# Patient Record
Sex: Male | Born: 1960 | Race: Black or African American | Hispanic: No | Marital: Single | Smoking: Current some day smoker
Health system: Southern US, Community
[De-identification: ages and names within clinical notes are randomized; demographics above are authoritative.]

## PROBLEM LIST (undated history)

## (undated) DIAGNOSIS — I639 Cerebral infarction, unspecified: Secondary | ICD-10-CM

## (undated) DIAGNOSIS — I10 Essential (primary) hypertension: Secondary | ICD-10-CM

## (undated) DIAGNOSIS — I739 Peripheral vascular disease, unspecified: Secondary | ICD-10-CM

## (undated) DIAGNOSIS — I2510 Atherosclerotic heart disease of native coronary artery without angina pectoris: Secondary | ICD-10-CM

## (undated) DIAGNOSIS — E785 Hyperlipidemia, unspecified: Secondary | ICD-10-CM

## (undated) DIAGNOSIS — G4733 Obstructive sleep apnea (adult) (pediatric): Secondary | ICD-10-CM

## (undated) HISTORY — PX: FEMORAL-POPLITEAL BYPASS GRAFT: SHX937

---

## 2012-02-24 DIAGNOSIS — I10 Essential (primary) hypertension: Secondary | ICD-10-CM | POA: Insufficient documentation

## 2012-02-24 DIAGNOSIS — I639 Cerebral infarction, unspecified: Secondary | ICD-10-CM | POA: Insufficient documentation

## 2013-02-04 ENCOUNTER — Encounter (HOSPITAL_COMMUNITY): Payer: Self-pay | Admitting: Pulmonary Disease

## 2013-02-04 ENCOUNTER — Inpatient Hospital Stay (HOSPITAL_COMMUNITY): Payer: Medicare Other

## 2013-02-04 ENCOUNTER — Inpatient Hospital Stay (HOSPITAL_COMMUNITY)
Admission: AD | Admit: 2013-02-04 | Discharge: 2013-02-06 | DRG: 683 | Discharge status: Against Medical Advice | Payer: Medicare Other | Source: Other Acute Inpatient Hospital | Attending: Internal Medicine | Admitting: Internal Medicine

## 2013-02-04 DIAGNOSIS — E8779 Other fluid overload: Secondary | ICD-10-CM | POA: Diagnosis present

## 2013-02-04 DIAGNOSIS — G4733 Obstructive sleep apnea (adult) (pediatric): Secondary | ICD-10-CM | POA: Diagnosis present

## 2013-02-04 DIAGNOSIS — J449 Chronic obstructive pulmonary disease, unspecified: Secondary | ICD-10-CM | POA: Diagnosis present

## 2013-02-04 DIAGNOSIS — N185 Chronic kidney disease, stage 5: Secondary | ICD-10-CM | POA: Diagnosis present

## 2013-02-04 DIAGNOSIS — R110 Nausea: Secondary | ICD-10-CM | POA: Diagnosis present

## 2013-02-04 DIAGNOSIS — Z7982 Long term (current) use of aspirin: Secondary | ICD-10-CM

## 2013-02-04 DIAGNOSIS — E875 Hyperkalemia: Secondary | ICD-10-CM

## 2013-02-04 DIAGNOSIS — I161 Hypertensive emergency: Secondary | ICD-10-CM | POA: Diagnosis present

## 2013-02-04 DIAGNOSIS — Z9861 Coronary angioplasty status: Secondary | ICD-10-CM

## 2013-02-04 DIAGNOSIS — Z8673 Personal history of transient ischemic attack (TIA), and cerebral infarction without residual deficits: Secondary | ICD-10-CM

## 2013-02-04 DIAGNOSIS — I120 Hypertensive chronic kidney disease with stage 5 chronic kidney disease or end stage renal disease: Principal | ICD-10-CM | POA: Diagnosis present

## 2013-02-04 DIAGNOSIS — Z79899 Other long term (current) drug therapy: Secondary | ICD-10-CM

## 2013-02-04 DIAGNOSIS — I5023 Acute on chronic systolic (congestive) heart failure: Secondary | ICD-10-CM | POA: Diagnosis present

## 2013-02-04 DIAGNOSIS — N179 Acute kidney failure, unspecified: Secondary | ICD-10-CM

## 2013-02-04 DIAGNOSIS — F17200 Nicotine dependence, unspecified, uncomplicated: Secondary | ICD-10-CM | POA: Diagnosis present

## 2013-02-04 DIAGNOSIS — J4489 Other specified chronic obstructive pulmonary disease: Secondary | ICD-10-CM | POA: Diagnosis present

## 2013-02-04 DIAGNOSIS — I739 Peripheral vascular disease, unspecified: Secondary | ICD-10-CM | POA: Diagnosis present

## 2013-02-04 DIAGNOSIS — E669 Obesity, unspecified: Secondary | ICD-10-CM | POA: Diagnosis present

## 2013-02-04 DIAGNOSIS — E785 Hyperlipidemia, unspecified: Secondary | ICD-10-CM | POA: Diagnosis present

## 2013-02-04 DIAGNOSIS — D649 Anemia, unspecified: Secondary | ICD-10-CM | POA: Diagnosis present

## 2013-02-04 DIAGNOSIS — R062 Wheezing: Secondary | ICD-10-CM | POA: Diagnosis present

## 2013-02-04 DIAGNOSIS — Z720 Tobacco use: Secondary | ICD-10-CM | POA: Diagnosis present

## 2013-02-04 DIAGNOSIS — I2510 Atherosclerotic heart disease of native coronary artery without angina pectoris: Secondary | ICD-10-CM | POA: Diagnosis present

## 2013-02-04 HISTORY — DX: Essential (primary) hypertension: I10

## 2013-02-04 HISTORY — DX: Obstructive sleep apnea (adult) (pediatric): G47.33

## 2013-02-04 HISTORY — DX: Cerebral infarction, unspecified: I63.9

## 2013-02-04 HISTORY — DX: Atherosclerotic heart disease of native coronary artery without angina pectoris: I25.10

## 2013-02-04 HISTORY — DX: Peripheral vascular disease, unspecified: I73.9

## 2013-02-04 HISTORY — DX: Hyperlipidemia, unspecified: E78.5

## 2013-02-04 LAB — BASIC METABOLIC PANEL
Chloride: 105 mEq/L (ref 96–112)
Creatinine, Ser: 7.38 mg/dL — ABNORMAL HIGH (ref 0.50–1.35)
GFR calc Af Amer: 9 mL/min — ABNORMAL LOW (ref >90)
Glucose, Bld: 102 mg/dL — ABNORMAL HIGH (ref 70–99)
Potassium: 5 mEq/L (ref 3.5–5.1)

## 2013-02-04 LAB — CBC WITH DIFFERENTIAL/PLATELET
Basophils Absolute: 0 10*3/uL (ref 0.0–0.1)
Eosinophils Relative: 3 % (ref 0–5)
HCT: 35.2 % — ABNORMAL LOW (ref 39.0–52.0)
Lymphocytes Relative: 16 % (ref 12–46)
Lymphs Abs: 1.8 10*3/uL (ref 0.7–4.0)
MCV: 88.4 fL (ref 78.0–100.0)
Monocytes Absolute: 0.8 10*3/uL (ref 0.1–1.0)
Monocytes Relative: 7 % (ref 3–12)
Neutrophils Relative %: 74 % (ref 43–77)
RDW: 14.3 % (ref 11.5–15.5)
WBC: 11.4 10*3/uL — ABNORMAL HIGH (ref 4.0–10.5)

## 2013-02-04 LAB — CK: Total CK: 489 U/L — ABNORMAL HIGH (ref 7–232)

## 2013-02-04 LAB — URINE MICROSCOPIC-ADD ON

## 2013-02-04 LAB — URINALYSIS, ROUTINE W REFLEX MICROSCOPIC
Leukocytes, UA: NEGATIVE
Nitrite: NEGATIVE
Specific Gravity, Urine: 1.013 (ref 1.005–1.030)
pH: 5.5 (ref 5.0–8.0)

## 2013-02-04 LAB — RAPID URINE DRUG SCREEN, HOSP PERFORMED
Benzodiazepines: NOT DETECTED
Cocaine: NOT DETECTED
Opiates: NOT DETECTED

## 2013-02-04 LAB — HEPATITIS B SURFACE ANTIBODY,QUALITATIVE: Hep B S Ab: NEGATIVE

## 2013-02-04 LAB — MAGNESIUM: Magnesium: 1.7 mg/dL (ref 1.5–2.5)

## 2013-02-04 LAB — TROPONIN I: Troponin I: <0.3 ng/mL (ref <0.30)

## 2013-02-04 LAB — PROTIME-INR: INR: 1.01 (ref 0.00–1.49)

## 2013-02-04 MED ORDER — CARVEDILOL 25 MG PO TABS
25.0000 mg | ORAL_TABLET | Freq: Two times a day (BID) | ORAL | Status: DC
  Administered 2013-02-04 – 2013-02-06 (×5): 25 mg via ORAL
  Filled 2013-02-04 (×7): qty 1

## 2013-02-04 MED ORDER — NICARDIPINE HCL IN NACL 20-0.86 MG/200ML-% IV SOLN: 5.0000 mg/h | INTRAVENOUS | Status: DC

## 2013-02-04 MED ORDER — HYDRALAZINE HCL 20 MG/ML IJ SOLN
10.0000 mg | Freq: Three times a day (TID) | INTRAMUSCULAR | Status: DC | PRN
  Administered 2013-02-05: 10 mg via INTRAVENOUS
  Filled 2013-02-04 (×2): qty 1

## 2013-02-04 MED ORDER — ASPIRIN 300 MG RE SUPP
300.0000 mg | Freq: Every day | RECTAL | Status: DC
  Filled 2013-02-04 (×3): qty 1

## 2013-02-04 MED ORDER — ONDANSETRON HCL 4 MG/2ML IJ SOLN
4.0000 mg | Freq: Four times a day (QID) | INTRAMUSCULAR | Status: DC | PRN
  Administered 2013-02-04 – 2013-02-05 (×2): 4 mg via INTRAVENOUS
  Filled 2013-02-04 (×2): qty 2

## 2013-02-04 MED ORDER — HEPARIN SODIUM (PORCINE) 5000 UNIT/ML IJ SOLN
5000.0000 [IU] | Freq: Three times a day (TID) | INTRAMUSCULAR | Status: DC
  Administered 2013-02-04 – 2013-02-06 (×7): 5000 [IU] via SUBCUTANEOUS
  Filled 2013-02-04 (×11): qty 1

## 2013-02-04 MED ORDER — FUROSEMIDE 10 MG/ML IJ SOLN
100.0000 mg | Freq: Once | INTRAVENOUS | Status: AC
  Administered 2013-02-04: 100 mg via INTRAVENOUS
  Filled 2013-02-04: qty 10

## 2013-02-04 MED ORDER — MAGNESIUM SULFATE 40 MG/ML IJ SOLN
2.0000 g | Freq: Once | INTRAMUSCULAR | Status: AC
  Administered 2013-02-04: 2 g via INTRAVENOUS
  Filled 2013-02-04: qty 50

## 2013-02-04 MED ORDER — ACETAMINOPHEN 325 MG PO TABS
650.0000 mg | ORAL_TABLET | ORAL | Status: DC | PRN
  Administered 2013-02-05: 650 mg via ORAL
  Filled 2013-02-04: qty 2

## 2013-02-04 MED ORDER — SODIUM CHLORIDE 0.9 % IV SOLN: 250.0000 mL | INTRAVENOUS | Status: DC | PRN

## 2013-02-04 MED ORDER — ASPIRIN 81 MG PO CHEW
81.0000 mg | CHEWABLE_TABLET | Freq: Every day | ORAL | Status: DC
  Administered 2013-02-04 – 2013-02-06 (×3): 81 mg via ORAL
  Filled 2013-02-04 (×3): qty 1

## 2013-02-04 MED ORDER — ALBUTEROL SULFATE (5 MG/ML) 0.5% IN NEBU: 2.5000 mg | INHALATION_SOLUTION | RESPIRATORY_TRACT | Status: DC | PRN

## 2013-02-04 MED ORDER — HYDRALAZINE HCL 25 MG PO TABS
25.0000 mg | ORAL_TABLET | Freq: Three times a day (TID) | ORAL | Status: DC
  Administered 2013-02-04 – 2013-02-05 (×3): 25 mg via ORAL
  Filled 2013-02-04 (×6): qty 1

## 2013-02-04 MED ORDER — NITROGLYCERIN IN D5W 200-5 MCG/ML-% IV SOLN
2.0000 ug/min | INTRAVENOUS | Status: DC
  Administered 2013-02-04: 30 ug/min via INTRAVENOUS
  Filled 2013-02-04: qty 250

## 2013-02-04 MED ORDER — SIMVASTATIN 20 MG PO TABS
20.0000 mg | ORAL_TABLET | Freq: Every day | ORAL | Status: DC
  Administered 2013-02-04 – 2013-02-05 (×2): 20 mg via ORAL
  Filled 2013-02-04 (×3): qty 1

## 2013-02-04 MED ORDER — NICARDIPINE HCL IN NACL 20-0.86 MG/200ML-% IV SOLN
5.0000 mg/h | INTRAVENOUS | Status: DC
  Filled 2013-02-04: qty 200

## 2013-02-04 MED ORDER — FENTANYL CITRATE 0.05 MG/ML IJ SOLN
25.0000 ug | INTRAMUSCULAR | Status: DC | PRN
Start: 2013-02-04 — End: 2013-02-06
  Administered 2013-02-04 – 2013-02-06 (×12): 50 ug via INTRAVENOUS
  Filled 2013-02-04 (×13): qty 2

## 2013-02-04 NOTE — H&P (Signed)
PULMONARY  / CRITICAL CARE MEDICINE  Name: John Ward MRN: [id number] DOB: 1960-07-17    ADMISSION DATE:  02/04/2013 CONSULTATION DATE:  02/04/2013  REFERRING MD :  OSH ED PRIMARY SERVICE: PCCM  CHIEF COMPLAINT:  Headache, nausea, wheezing  BRIEF PATIENT DESCRIPTION: 52 y/o male admitted on 10/2 with hypertensive emergency, volume overload and acute on chronic kidney failure.  SIGNIFICANT EVENTS / STUDIES:  10/1 CT head OSH> R ethmoid air cell opacification; NAICP  LINES / TUBES:  CULTURES:   ANTIBIOTICS:   HISTORY OF PRESENT ILLNESS:  52 y/o male admitted on 10/2 with hypertensive emergency, volume overload and acute on chronic kidney failure. He stated that he was feeling well until 9/30 when he developed nausea, headache, and wheezing in the evening.  These symptoms persisted until 10/1 when his wife made him go to the ED for further evaluation.  There he had a blood pressure of 257/152 on arrival.  He received multiple blood pressure medicines and felt better.  He was found to have a Cr of 7 so he was sent to our hospital for further evaluation.  PAST MEDICAL HISTORY :  Past Medical History  Diagnosis Date  . Hyperlipidemia   . CVA (cerebral infarction)   . HTN (hypertension)   . Peripheral vascular disease   . CAD (coronary artery disease)     PCI 2013 [facility]  . OSA (obstructive sleep apnea)    Past Surgical History  Procedure Laterality Date  . Femoral-popliteal bypass graft Bilateral    Prior to Admission medications   Medication Sig Start Date End Date Taking? Authorizing Provider  aspirin 81 MG tablet Take 81 mg by mouth daily.   Yes Historical Provider, MD  carvedilol (COREG) 25 MG tablet Take 25 mg by mouth 2 (two) times daily with a meal.   Yes Historical Provider, MD  hydrALAZINE (APRESOLINE) 50 MG tablet Take 50 mg by mouth 3 (three) times daily.   Yes Historical Provider, MD  NIFEdipine (PROCARDIA XL/ADALAT-CC) 60 MG 24 hr tablet Take 60 mg by mouth  daily.   Yes Historical Provider, MD  nitroGLYCERIN (NITROSTAT) 0.4 MG SL tablet Place 0.4 mg under the tongue every 5 (five) minutes as needed for chest pain.   Yes Historical Provider, MD  simvastatin (ZOCOR) 20 MG tablet Take 20 mg by mouth at bedtime.   Yes Historical Provider, MD  tamsulosin (FLOMAX) 0.4 MG CAPS capsule Take 0.4 mg by mouth.   Yes Historical Provider, MD   Allergies  Allergen Reactions  . Morphine And Related   . Oxycodone   . Trazodone And Nefazodone   . Vicodin [Hydrocodone-Acetaminophen]     FAMILY HISTORY:  No family history on file. SOCIAL HISTORY:  reports that he has been smoking Cigarettes.  He has a 30 pack-year smoking history. He does not have any smokeless tobacco history on file. He reports that he does not drink alcohol or use illicit drugs.  REVIEW OF SYSTEMS:  Gen: Denies fever, chills, weight change, fatigue, night sweats HEENT: Denies blurred vision, double vision, hearing loss, tinnitus, sinus congestion, rhinorrhea, sore throat, neck stiffness, dysphagia PULM: Denies shortness of breath, cough, sputum production, hemoptysis, + wheezing CV: Denies chest pain, edema, orthopnea, paroxysmal nocturnal dyspnea, palpitations GI: Denies abdominal pain, + nausea, denies vomiting, diarrhea, hematochezia, melena, constipation, change in bowel habits GU: Denies dysuria, hematuria, polyuria, oliguria, urethral discharge Endocrine: Denies hot or cold intolerance, polyuria, polyphagia or appetite change Derm: Denies rash, dry skin, scaling or peeling skin  change Heme: Denies easy bruising, bleeding, bleeding gums Neuro: + headache, denies numbness, weakness, slurred speech, loss of memory or consciousness   SUBJECTIVE:   VITAL SIGNS: Pulse Rate:  [71-76] 76 (10/02 0245) Resp:  [16-19] 19 (10/02 0245) BP: (164-187)/(117-121) 164/121 mmHg (10/02 0245) SpO2:  [94 %-97 %] 97 % (10/02 0245) Weight:  [111.1 kg (244 lb 14.9 oz)] 111.1 kg (244 lb 14.9 oz)  (10/02 0235) HEMODYNAMICS:   VENTILATOR SETTINGS:   INTAKE / OUTPUT: Intake/Output   None     PHYSICAL EXAMINATION:  Gen:  no acute distress HEENT: NCAT, PERRL, EOMi, OP clear, neck supple without masses PULM: wheezing bilaterally, diminished breath sounds R base CV: RRR, no mgr, no JVD AB: BS+, soft, nontender, no hsm Ext: warm, trace ankle edema, no clubbing, no cyanosis Derm: no rash or skin breakdown Neuro: A&Ox4, CN II-XII intact, MAEW   LABS:  OSH labs reviewed, Cr 6.9, K 5.2, CO2 26.8, Na 141; WBC 12.9, Hgb 12.2, PLT205, Troponin 0.04, LFTs OK, CK 486  CBC No results found for this basename: WBC, HGB, HCT, PLT,  in the last 72 hours Coag's No results found for this basename: APTT, INR,  in the last 72 hours BMET No results found for this basename: NA, K, CL, CO2, BUN, CREATININE, GLUCOSE,  in the last 72 hours Electrolytes No results found for this basename: CALCIUM, MG, PHOS,  in the last 72 hours Sepsis Markers No results found for this basename: LACTICACIDVEN, PROCALCITON, O2SATVEN,  in the last 72 hours ABG No results found for this basename: PHART, PCO2ART, PO2ART,  in the last 72 hours Liver Enzymes No results found for this basename: AST, ALT, ALKPHOS, BILITOT, ALBUMIN,  in the last 72 hours Cardiac Enzymes No results found for this basename: TROPONINI, PROBNP,  in the last 72 hours Glucose Recent Labs     02/04/13  0249  GLUCAP  102*    Imaging No results found.   CXR: (OSH images reviewed): pulmonary vascular cephalization, no pulm edema or effusion  ASSESSMENT / PLAN:  PULMONARY A:Wheezing> most likely due to volume overload; consider COPD given smoking history P:   -prn albuterol -see cardiology  CARDIOVASCULAR A: Hypertensive emergency> most likely due to progressive kidney disease; consider renal artery stenosis Known CAD, PVD, and cerebral vascular disease Volume overload more likely due to renal disease than CHF P:   -echo -nitroglycerin gtt, initial MAP at ER was 187, so goal MAP first 24 hours is 140 -EKG -continue home coreg -continue ASA  RENAL A:  Acute on chronic renal failure, baseline Cr 4-5 P:   -check u/a, hepatitis serologies, renal artery ultrasound -lasix x1 -foley -renal consult AM 10/2  GASTROINTESTINAL A:  Nausea due to uremia P:   -prn zofran  HEMATOLOGIC A:  No acute issues P:  -check INR  INFECTIOUS A:  No acute issues P:     ENDOCRINE A:  No acute issues P:   -monitor glucose  NEUROLOGIC A:  Headache due to hypertension P:   -APAP prn, fentanyl for breakthrough pain  TODAY'S SUMMARY:   Code: Full Family: wife coming in AM 10/2  I have personally obtained a history, examined the patient, evaluated laboratory and imaging results, formulated the assessment and plan and placed orders. CRITICAL CARE: The patient is critically ill with multiple organ systems failure and requires high complexity decision making for assessment and support, frequent evaluation and titration of therapies, application of advanced monitoring technologies and extensive interpretation of multiple databases.  Critical Care Time devoted to patient care services described in this note is 60 minutes.   Lemmie [facility] [place] Pager: [phone]  02/04/2013, 3:29 AM

## 2013-02-04 NOTE — Progress Notes (Signed)
PULMONARY  / CRITICAL CARE MEDICINE  Name: Enzio Caterino MRN: [id number] DOB: 1960-05-20    ADMISSION DATE:  02/04/2013 CONSULTATION DATE:  02/04/2013  REFERRING MD :  OSH ED PRIMARY SERVICE: PCCM Reason for admission:  Hypertensive emergency  BRIEF PATIENT DESCRIPTION: 52 y/o male admitted on 10/2 from OSH with hypertensive emergency (MAP 187), volume overload and acute on chronic kidney failure (Cr 6.9).  SIGNIFICANT EVENTS / STUDIES:  10/1 - MAP at OSH ED 187, transferred here to ICU  -CT head OSH> R ethmoid air cell opacification; NAICP 10/2 - remains on NTG drip  LINES / TUBES: PIV  CULTURES: n/a  ANTIBIOTICS: n/a  SUBJECTIVE: Complains of severe headache. Denies blurred vision, chest pain, difficulty breathing, abd pain. Normal BM this morning. Feels like urination is normal.  VITAL SIGNS: Temp:  [98 F (36.7 C)-98.8 F (37.1 C)] 98.8 F (37.1 C) (10/02 0732) Pulse Rate:  [65-76] 65 (10/02 1000) Resp:  [16-22] 19 (10/02 1000) BP: (153-187)/(96-121) 155/105 mmHg (10/02 1000) SpO2:  [88 %-97 %] 89 % (10/02 1000) Weight:  [244 lb 14.9 oz (111.1 kg)] 244 lb 14.9 oz (111.1 kg) (10/02 0235) HEMODYNAMICS:   VENTILATOR SETTINGS:   INTAKE / OUTPUT: Intake/Output     10/01 0701 - 10/02 0700 10/02 0701 - 10/03 0700   P.O.  120   I.V. (mL/kg) 34.5 (0.3) 15 (0.1)   IV Piggyback 60    Total Intake(mL/kg) 94.5 (0.9) 135 (1.2)   Urine (mL/kg/hr) 100    Total Output 100     Net -5.5 +135        Stool Occurrence 1 x      PHYSICAL EXAMINATION: Gen:  no acute distress, lying in bed HEENT: NCAT, EOMI, mild bilateral conjunctival injection PULM: diffuse wheezing bilaterally, normal work of breathing CV: RRR, no murmur appreciated AB: BS+, soft, nontender; obese Ext: warm, trace ankle edema equal bilaterally, no cyanosis/clubbing Skin: no rash or skin breakdown Neuro: A&Ox4, moves all ext equally  LABS:  OSH labs: Cr 6.9, K 5.2, CO2 26.8, Na 141; WBC 12.9, Hgb  12.2, PLT205, Troponin 0.04, LFTs OK, CK 486  CBC Recent Labs     02/04/13  0430  WBC  11.4*  HGB  11.6*  HCT  35.2*  PLT  194   Coag's Recent Labs     02/04/13  0430  INR  1.01   BMET Recent Labs     02/04/13  0430  NA  142  K  5.0  CL  105  CO2  24  BUN  49*  CREATININE  7.38*  GLUCOSE  102*   Electrolytes Recent Labs     02/04/13  0430  CALCIUM  8.5  MG  1.7  PHOS  5.0*   Sepsis Markers No results found for this basename: LACTICACIDVEN, PROCALCITON, O2SATVEN,  in the last 72 hours ABG No results found for this basename: PHART, PCO2ART, PO2ART,  in the last 72 hours Liver Enzymes No results found for this basename: AST, ALT, ALKPHOS, BILITOT, ALBUMIN,  in the last 72 hours Cardiac Enzymes Recent Labs     02/04/13  0735  TROPONINI  <0.30  PROBNP  2692.0*   Glucose Recent Labs     02/04/13  0249  GLUCAP  102*    Imaging Dg Chest Port 1 View  02/04/2013   CLINICAL DATA:  Shortness of Breath.  EXAM: PORTABLE CHEST - 1 VIEW  COMPARISON:  None.  FINDINGS: Heart is upper limits normal in  size. Mild peribronchial thickening without confluent airspace opacity or effusion. No acute bony abnormality.  IMPRESSION: Mild bronchitic changes.   Electronically Signed   By: Rolm Baptise M.D.   On: 02/04/2013 06:11    CXR: mild bronchitic changes, no acute infiltrates  ASSESSMENT / PLAN:  PULMONARY A:Wheezing - mild volume overload, +/- COPD given smoking history (denies prior diagnosis) Mild edema  P:   -albuterol PRN -see cardiovascular below -lasix given  CARDIOVASCULAR A: Hypertensive emergency - ?2/2 progressive kidney disease; consider renal artery stenosis Known CAD, PVD, and cerebral vascular disease Volume overload more likely due to renal disease than new CHF (BNP 2692 but GFR <10) P:  -echo pending -initial MAP at ER was 187, so goal MAP first 24 hours is 140 (most recent 119) -Not tolerating NTG well with severe headache (no major edema, no  overt failure picture) , dc this , add nicardipine infusion (hr now 75) if required -EKG reviewed - NSR, flipped T-waves laterally, troponin <0.3 -continue home coreg; holding Procardia -continue ASA -as HR 75, add prn hydralazine Iv on oral above -echo for cardiomyoapthy pending  RENAL A:  Acute on chronic renal failure, baseline Cr 4-5, low Mag, r/o RAS P:   -UA reviewed, >300 protein and trace Hb - hepatitis serologies and renal artery ultrasound pending -hold lasix today, follow crt trend -bmet in pm  -foley -renal consult may be required -keep euvolemic  GASTROINTESTINAL A:  Nausea due to uremia P:   -prn zofran -diet  HEMATOLOGIC A:  No acute issues P:  -INR 1.01 10/2 -sub q heparin  INFECTIOUS A:  No acute issues P:  n/a  ENDOCRINE A:  No acute issues P:  monitor glucose with metabolic panels PRN  NEUROLOGIC A:  Headache due to hypertension P:   -Tylenol PRN, fentanyl for breakthrough pain -dc NTG  TODAY'S SUMMARY: 52yo with hypertensive emergency and acute on chronic renal failure, MAP goal in first 24h 140, now ~120. Wean nitro drip, may need nicardipine  Code: Full Family: wife coming in AM 10/2  Emmaline Kluver, MD PGY-2, [place] 02/04/2013, 12:00 PM  Ccm time 30 min  I have fully examined this patient and agree with above findings.    And edited in full  Lavon Paganini. Titus Mould, MD, [place] Pgr: [place] [facility]

## 2013-02-04 NOTE — Care Management Note (Signed)
    Page 1 of 1   02/04/2013     8:16:42 AM   CARE MANAGEMENT NOTE 02/04/2013  Patient:  John Ward, John Ward   Account Number:  [id number]  Date Initiated:  02/04/2013  Documentation initiated by:  Luz Lex  Subjective/Objective Assessment:   Admitted with Hypertensive crisis - renal failure.     Action/Plan:   Anticipated DC Date:  02/08/2013   Anticipated DC Plan:  [place]  CM consult      Choice offered to / List presented to:             Status of service:  In process, will continue to follow Medicare Important Message given?   (If response is "NO", the following Medicare IM given date fields will be blank) Date Medicare IM given:   Date Additional Medicare IM given:    Discharge Disposition:    Per UR Regulation:  Reviewed for med. necessity/level of care/duration of stay  If discussed at [place] of Stay Meetings, dates discussed:    Comments:  Contact:  Abruzzese,Francine Spouse [phone]

## 2013-02-04 NOTE — Progress Notes (Signed)
PCCM PGY-2 Update Note  Pt and family discussed current condition at bedside on formal rounds this morning with Dr. Titus Mould. Pt reports his PCP is Dr. Lehman Prom at [facility] in [place], [phone], and that he normally gets hospital care at [facility] or [facility]; pt questions if he could be transferred for continuity of care. Discussed with Dr. Titus Mould and family that this is a possibility, but a doctor at one of those hospitals would have to accept him in transfer, he would have to be stable for transfer (which he appears to be), there would have to be bed availability, etc.  Contacted PCP's office. They manage his hypertensive medications and do not have any information about whether he sees a cardiologist and they do not have admitting privileges at either hospital above, nor do they have information available about prior admissions (who saw the pt, etc); pt is not sure who has seen him at either of those hospitals, either. Discussed with Dr. Titus Mould. At this point in the day, pt is stable/improving. Will plan to re-evaluate pt tomorrow and possibly discuss transfer again vs continuing to manage pt here as appropriate. Pt is agreeable to this plan.  Emmaline Kluver, MD PGY-2, [place] [facility] 02/04/2013, 3:42 PM

## 2013-02-05 ENCOUNTER — Encounter (HOSPITAL_COMMUNITY): Payer: Self-pay | Admitting: Emergency Medicine

## 2013-02-05 DIAGNOSIS — I517 Cardiomegaly: Secondary | ICD-10-CM

## 2013-02-05 DIAGNOSIS — N179 Acute kidney failure, unspecified: Secondary | ICD-10-CM

## 2013-02-05 DIAGNOSIS — N189 Chronic kidney disease, unspecified: Secondary | ICD-10-CM

## 2013-02-05 DIAGNOSIS — I10 Essential (primary) hypertension: Secondary | ICD-10-CM

## 2013-02-05 DIAGNOSIS — R110 Nausea: Secondary | ICD-10-CM

## 2013-02-05 DIAGNOSIS — E875 Hyperkalemia: Secondary | ICD-10-CM

## 2013-02-05 LAB — BASIC METABOLIC PANEL
BUN: 53 mg/dL — ABNORMAL HIGH (ref 6–23)
BUN: 54 mg/dL — ABNORMAL HIGH (ref 6–23)
CO2: 23 mEq/L (ref 19–32)
CO2: 25 mEq/L (ref 19–32)
Calcium: 8.5 mg/dL (ref 8.4–10.5)
Chloride: 102 mEq/L (ref 96–112)
Creatinine, Ser: 7.48 mg/dL — ABNORMAL HIGH (ref 0.50–1.35)
Creatinine, Ser: 7.73 mg/dL — ABNORMAL HIGH (ref 0.50–1.35)
GFR calc Af Amer: 8 mL/min — ABNORMAL LOW (ref >90)
GFR calc Af Amer: 9 mL/min — ABNORMAL LOW (ref >90)
GFR calc non Af Amer: 7 mL/min — ABNORMAL LOW (ref >90)
GFR calc non Af Amer: 8 mL/min — ABNORMAL LOW (ref >90)
Glucose, Bld: 128 mg/dL — ABNORMAL HIGH (ref 70–99)
Potassium: 4.7 mEq/L (ref 3.5–5.1)
Sodium: 136 mEq/L (ref 135–145)
Sodium: 136 mEq/L (ref 135–145)

## 2013-02-05 MED ORDER — HYDRALAZINE HCL 50 MG PO TABS
50.0000 mg | ORAL_TABLET | Freq: Three times a day (TID) | ORAL | Status: DC
  Administered 2013-02-05 – 2013-02-06 (×3): 50 mg via ORAL
  Filled 2013-02-05 (×6): qty 1

## 2013-02-05 MED ORDER — NIFEDIPINE ER 60 MG PO TB24
60.0000 mg | ORAL_TABLET | Freq: Every day | ORAL | Status: DC
  Administered 2013-02-05 – 2013-02-06 (×2): 60 mg via ORAL
  Filled 2013-02-05 (×2): qty 1

## 2013-02-05 MED ORDER — ALBUTEROL SULFATE (5 MG/ML) 0.5% IN NEBU
2.5000 mg | INHALATION_SOLUTION | Freq: Four times a day (QID) | RESPIRATORY_TRACT | Status: DC
  Administered 2013-02-05 – 2013-02-06 (×4): 2.5 mg via RESPIRATORY_TRACT
  Filled 2013-02-05 (×4): qty 0.5

## 2013-02-05 MED FILL — Hydromorphone HCl Preservative Free (PF) Inj 2 MG/ML: INTRAMUSCULAR | Qty: 1 | Status: AC

## 2013-02-05 MED FILL — Nitroglycerin IV Soln 200 MCG/ML in D5W: INTRAVENOUS | Qty: 250 | Status: AC

## 2013-02-05 NOTE — Progress Notes (Signed)
PULMONARY  / CRITICAL CARE MEDICINE  Name: Azi Totin MRN: [id number] DOB: 10/10/1960    ADMISSION DATE:  02/04/2013 CONSULTATION DATE:  02/04/2013  REFERRING MD :  OSH ED PRIMARY SERVICE: PCCM Reason for admission:  Hypertensive emergency  BRIEF PATIENT DESCRIPTION: 52 y/o male admitted on 10/2 from OSH with hypertensive emergency (MAP 187), volume overload and acute on chronic kidney failure (Cr 6.9).  SIGNIFICANT EVENTS / STUDIES:  10/1 - MAP at OSH ED 187, transferred here to ICU  -CT head OSH> R ethmoid air cell opacification; NAICP 10/2 - remains on NTG drip >>> discontinued, transitioning toward home regimen 10/3 - MAP around 120 at target, headache resolved  LINES / TUBES: PIV  CULTURES: n/a  ANTIBIOTICS: n/a  SUBJECTIVE: Less headache this morning. Continues to deny blurred vision, chest pain, difficulty breathing, abd pain. Per RN, had been complaining of continued headache overnight - resolved, on Fentanyl q3.  VITAL SIGNS: Temp:  [98.6 F (37 C)-99.1 F (37.3 C)] 98.6 F (37 C) (10/02 2345) Pulse Rate:  [58-80] 58 (10/03 0700) Resp:  [15-26] 21 (10/03 0700) BP: (143-197)/(73-112) 175/96 mmHg (10/03 0700) SpO2:  [88 %-100 %] 99 % (10/03 0700) Weight:  [241 lb 2.9 oz (109.4 kg)] 241 lb 2.9 oz (109.4 kg) (10/03 0500) HEMODYNAMICS:   VENTILATOR SETTINGS:   INTAKE / OUTPUT: Intake/Output     10/02 0701 - 10/03 0700 10/03 0701 - 10/04 0700   P.O. 890    I.V. (mL/kg) 342.5 (3.1)    IV Piggyback 50    Total Intake(mL/kg) 1282.5 (11.7)    Urine (mL/kg/hr) 1775 (0.7)    Total Output 1775     Net -492.5            PHYSICAL EXAMINATION: Gen:  no acute distress, lying in bed HEENT: NCAT, EOMI, conjunctival injection improving PULM: diffuse wheezing bilaterally, less than previous days, normal work of breathing CV: RRR, no murmur appreciated AB: BS+, soft, nontender; obese Ext: warm, trace ankle edema equal bilaterally, no cyanosis/clubbing Skin: no  rash or skin breakdown Neuro: A&Ox4, moves all ext equally  LABS: OSH labs: Cr 6.9, K 5.2, CO2 26.8, Na 141; WBC 12.9, Hgb 12.2, PLT205, Troponin 0.04, LFTs OK, CK 486  CBC Recent Labs     02/04/13  0430  WBC  11.4*  HGB  11.6*  HCT  35.2*  PLT  194   Coag's Recent Labs     02/04/13  0430  INR  1.01   BMET Recent Labs     02/04/13  0430  02/05/13  0001  NA  142  136  K  5.0  5.1  CL  105  102  CO2  24  23  BUN  49*  53*  CREATININE  7.38*  7.48*  GLUCOSE  102*  103*   Electrolytes Recent Labs     02/04/13  0430  02/05/13  0001  CALCIUM  8.5  8.5  MG  1.7   --   PHOS  5.0*   --    Sepsis Markers No results found for this basename: LACTICACIDVEN, PROCALCITON, O2SATVEN,  in the last 72 hours ABG No results found for this basename: PHART, PCO2ART, PO2ART,  in the last 72 hours Liver Enzymes No results found for this basename: AST, ALT, ALKPHOS, BILITOT, ALBUMIN,  in the last 72 hours Cardiac Enzymes Recent Labs     02/04/13  0735  02/04/13  1335  02/05/13  0001  TROPONINI  <0.30  <  0.30  <0.30  PROBNP  2692.0*   --    --    Glucose Recent Labs     02/04/13  0249  GLUCAP  102*    Imaging Dg Chest Port 1 View  02/04/2013   CLINICAL DATA:  Shortness of Breath.  EXAM: PORTABLE CHEST - 1 VIEW  COMPARISON:  None.  FINDINGS: Heart is upper limits normal in size. Mild peribronchial thickening without confluent airspace opacity or effusion. No acute bony abnormality.  IMPRESSION: Mild bronchitic changes.   Electronically Signed   By: Rolm Baptise M.D.   On: 02/04/2013 06:11    CXR 10/2: mild bronchitic changes, no acute infiltrates  ASSESSMENT / PLAN:  PULMONARY A:Wheezing - mild volume overload, +/- COPD given smoking history (denies prior diagnosis) Mild edema  P:   -albuterol PRN change to q6h -see cardiovascular below -Lasix given 10/1 -may need further neg baalnce  CARDIOVASCULAR A: Hypertensive emergency - ?2/2 progressive kidney disease;  consider renal artery stenosis Known CAD, PVD, and cerebral vascular disease - troponin neg, EKG reviewed Volume overload  -improving, more likely renal disease than new CHF (BNP 2692 but GFR <10) P:  -continue home coreg -increase oral hydralazine from 25 to 50 TID (home dose), with IV PRN on top -on Procardia at home, consider restart (has nicardipine PRN if hydralazine PRN is not effective) -continue ASA -echo for cardiomyoapthy and renal artery [place] both pending -last HR 57, escalate hydralazine, may need clonidine -MAP goals remain 110-120 to 1-2 days then further  RENAL A:  Acute on chronic renal failure, baseline Cr 4-5, low Mag, r/o RAS Persistent elevated Cr P:  - hepatitis serologies negative  -renal artery ultrasound pending -continue to hold any Lasix today, follow crt trend -foley -renal consult today, will need outpt -keep euvolemic  GASTROINTESTINAL A:  Nausea, likely due to uremia - improving P:   -prn zofran -diet tolerated  HEMATOLOGIC A:  No acute issues P:  -INR 1.01 10/2, limit phlebotomy -sub q heparin until ambulation  INFECTIOUS A:  No acute issues P:  n/a  ENDOCRINE A:  No acute issues P:  monitor glucose with metabolic panels PRN  NEUROLOGIC A:  Headache due to hypertension - resolved, ct head neg P:   -Tylenol PRN, fentanyl for breakthrough pain -dc'd NTG  TODAY'S SUMMARY: 52yo with hypertensive emergency and acute on chronic renal failure, MAP still ~120. Increasing PO meds back to home doses. Consider transfer to [facility] or [facility] per pt/family request for better continuity of care (usually gets care at those hospitals, PCP in [place]). Call made, can repeat this, but likely to dc, need to assess echo  Code: Full  Emmaline Kluver, MD PGY-2, [place] [facility] 02/05/2013, 7:19 AM  I have fully examined this patient and agree with above findings.    And edited in full  Lavon Paganini. Titus Mould, MD, [place] Pgr:  [place] [facility]

## 2013-02-05 NOTE — Progress Notes (Signed)
  Echocardiogram 2D Echocardiogram has been performed.  John Ward 02/05/2013, 11:39 AM

## 2013-02-06 DIAGNOSIS — F17200 Nicotine dependence, unspecified, uncomplicated: Secondary | ICD-10-CM

## 2013-02-06 MED ORDER — TRAMADOL HCL 50 MG PO TABS
50.0000 mg | ORAL_TABLET | Freq: Two times a day (BID) | ORAL | Status: DC | PRN
  Administered 2013-02-06: 50 mg via ORAL
  Filled 2013-02-06: qty 1

## 2013-02-06 MED ORDER — HYDRALAZINE HCL 20 MG/ML IJ SOLN: 10.0000 mg | Freq: Three times a day (TID) | INTRAMUSCULAR | Status: DC | PRN

## 2013-02-06 NOTE — Consult Note (Signed)
Referring Provider: No ref. provider found Primary Care Physician:  Carley Hammed, MD Primary Nephrologist:  Dr. Shanda Howells nephrologist works in [facility]  Reason for Consultation:  Stage 5 Chronic kidney disease followed by primary nephrologist in [place] HPI: 52 y/o male admitted on 10/2 from OSH with hypertensive emergency (MAP 187), volume overload and acute on chronic kidney failure (Cr 6.9). Patient has a primary nephrologist in [place] and has appointment 10/10 with Dr Marcelyn Bruins. He has seen the dialysis video and saving his right arm for fistula placement. He denies any NSAID use although has a history of headache.    Past Medical History  Diagnosis Date  . Hyperlipidemia   . CVA (cerebral infarction)   . HTN (hypertension)   . Peripheral vascular disease   . CAD (coronary artery disease)     PCI 2013 [facility]  . OSA (obstructive sleep apnea)     Past Surgical History  Procedure Laterality Date  . Femoral-popliteal bypass graft Bilateral     Prior to Admission medications   Medication Sig Start Date End Date Taking? Authorizing Provider  aspirin 81 MG tablet Take 81 mg by mouth daily.   Yes Historical Provider, MD  carvedilol (COREG) 25 MG tablet Take 25 mg by mouth 2 (two) times daily with a meal.   Yes Historical Provider, MD  hydrALAZINE (APRESOLINE) 50 MG tablet Take 50 mg by mouth 3 (three) times daily.   Yes Historical Provider, MD  NIFEdipine (PROCARDIA XL/ADALAT-CC) 60 MG 24 hr tablet Take 60 mg by mouth daily.   Yes Historical Provider, MD  nitroGLYCERIN (NITROSTAT) 0.4 MG SL tablet Place 0.4 mg under the tongue every 5 (five) minutes as needed for chest pain.   Yes Historical Provider, MD  oxymorphone (OPANA) 5 MG tablet Take 5 mg by mouth 2 (two) times daily.   Yes Historical Provider, MD  simvastatin (ZOCOR) 20 MG tablet Take 20 mg by mouth at bedtime.   Yes Historical Provider, MD  tamsulosin (FLOMAX) 0.4 MG CAPS capsule Take 0.4 mg by mouth 2 (two) times  daily.   Yes Historical Provider, MD    Current Facility-Administered Medications  Medication Dose Route Frequency Provider Last Rate Last Dose  . 0.9 %  sodium chloride infusion  250 mL Intravenous PRN Juanito Doom, MD      . acetaminophen (TYLENOL) tablet 650 mg  650 mg Oral Q4H PRN Juanito Doom, MD   650 mg at 02/05/13 [id number]  . albuterol (PROVENTIL) (5 MG/ML) 0.5% nebulizer solution 2.5 mg  2.5 mg Nebulization Q6H Timmothy Euler, MD   2.5 mg at 02/06/13 0814  . aspirin chewable tablet 81 mg  81 mg Oral Daily Juanito Doom, MD   81 mg at 02/05/13 1000   Or  . aspirin suppository 300 mg  300 mg Rectal Daily Juanito Doom, MD      . carvedilol (COREG) tablet 25 mg  25 mg Oral BID WC Juanito Doom, MD   25 mg at 02/06/13 0600  . fentaNYL (SUBLIMAZE) injection 25-50 mcg  25-50 mcg Intravenous Q3H PRN Juanito Doom, MD   50 mcg at 02/06/13 0751  . heparin injection 5,000 Units  5,000 Units Subcutaneous Q8H Juanito Doom, MD   5,000 Units at 02/06/13 0557  . hydrALAZINE (APRESOLINE) injection 10 mg  10 mg Intravenous Q8H PRN Timmothy Euler, MD   10 mg at 02/05/13 1227  . hydrALAZINE (APRESOLINE) tablet 50 mg  50 mg  Oral Q8H Campus, MD   50 mg at 02/06/13 0556  . NIFEdipine (PROCARDIA-XL/ADALAT CC) 24 hr tablet 60 mg  60 mg Oral Daily Castalia, MD   60 mg at 02/05/13 1227  . ondansetron (ZOFRAN) injection 4 mg  4 mg Intravenous Q6H PRN Juanito Doom, MD   4 mg at 02/05/13 1659  . simvastatin (ZOCOR) tablet 20 mg  20 mg Oral q1800 Juanito Doom, MD   20 mg at 02/05/13 1659    Allergies as of 02/03/2013  . (Not on File)    History reviewed. No pertinent family history.  History   Social History  . Marital Status: Single    Spouse Name: N/A    Number of Children: N/A  . Years of Education: N/A   Occupational History  . Not on file.   Social History Main Topics  . Smoking status: Current Every Day Smoker -- 1.00  packs/day for 30 years    Types: Cigarettes  . Smokeless tobacco: Not on file  . Alcohol Use: No  . Drug Use: No  . Sexual Activity: Not on file   Other Topics Concern  . Not on file   Social History Narrative  . No narrative on file    Review of Systems: Gen: Denies any fever, chills, sweats, anorexia, fatigue, weakness, malaise, weight loss, and sleep disorder HEENT: No visual complaints, No history of Retinopathy. Normal external appearance No Epistaxis or Sore throat. No sinusitis.   CV: Denies chest pain, angina, palpitations, syncope, orthopnea, PND, peripheral edema, and claudication. Resp: Denies dyspnea at rest, dyspnea with exercise, cough, sputum, wheezing, coughing up blood, and pleurisy. Smokes 1PPD GI: Denies vomiting blood, jaundice, and fecal incontinence.   Denies dysphagia or odynophagia. GU : Denies urinary burning, blood in urine, urinary frequency, urinary hesitancy, nocturnal urination, and urinary incontinence.  No renal calculi. MS: Denies joint pain, limitation of movement, and swelling, stiffness, low back pain, extremity pain. Denies muscle weakness, cramps, atrophy.  No use of non steroidal antiinflammatory drugs. Derm: Denies rash, itching, dry skin, hives, moles, warts, or unhealing ulcers.  Psych: Denies depression, anxiety, memory loss, suicidal ideation, hallucinations, paranoia, and confusion. Heme: Denies bruising, bleeding, and enlarged lymph nodes. 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  No diplopia. No dysarthria.  No dysphasia.  No history of CVA.  No Seizures. No paresthesias.  No weakness. Endocrine No DM.  No Thyroid disease.  No Adrenal disease.  Physical Exam: Vital signs in last 24 hours: Temp:  [98 F (36.7 C)-99.4 F (37.4 C)] 98.5 F (36.9 C) (10/04 0412) Pulse Rate:  [56-76] 68 (10/04 0412) Resp:  [17-21] 20 (10/03 2021) BP: (146-206)/(82-115) 146/89 mmHg (10/04 0412) SpO2:  [94 %-99 %] 94 % (10/04 0815) Weight:   [108.6 kg (239 lb 6.7 oz)] 108.6 kg (239 lb 6.7 oz) (10/04 0412) Last BM Date: 02/04/13 General:   Alert,  Well-developed, well-nourished, pleasant and cooperative in NAD Head:  Normocephalic and atraumatic. Eyes:  Sclera clear, no icterus.   Conjunctiva pink. Ears:  Normal auditory acuity. Nose:  No deformity, discharge,  or lesions. Mouth:  No deformity or lesions, dentition normal. Neck:  Supple; no masses or thyromegaly. JVP not elevated Lungs:  Clear throughout to auscultation.   No wheezes, crackles, or rhonchi. No acute distress. Heart:  Regular rate and rhythm; no murmurs, clicks, rubs,  or gallops. Abdomen:  Soft, nontender and nondistended. No masses, hepatosplenomegaly or hernias noted. Normal bowel sounds, without  guarding, and without rebound.   Msk:  Symmetrical without gross deformities. Normal posture. Pulses:  No carotid, renal, femoral bruits. DP and PT symmetrical and equal Extremities:  Without clubbing or edema. Neurologic:  Alert and  oriented x4;  grossly normal neurologically. Skin:  Intact without significant lesions or rashes. Cervical Nodes:  No significant cervical adenopathy. Psych:  Alert and cooperative. Normal mood and affect.  Intake/Output from previous day: 10/03 0701 - 10/04 0700 In: 1060 [P.O.:960; I.V.:100] Out: 1750 [Urine:1750] Intake/Output this shift:    Lab Results:  Recent Labs  02/04/13 0430  WBC 11.4*  HGB 11.6*  HCT 35.2*  PLT 194   BMET  Recent Labs  02/04/13 0430 02/05/13 0001 02/05/13 1125  NA 142 136 136  K 5.0 5.1 4.7  CL 105 102 101  CO2 24 23 25   GLUCOSE 102* 103* 128*  BUN 49* 53* 54*  CREATININE 7.38* 7.48* 7.73*  CALCIUM 8.5 8.5 8.5  PHOS 5.0*  --   --    LFT No results found for this basename: PROT, ALBUMIN, AST, ALT, ALKPHOS, BILITOT, BILIDIR, IBILI,  in the last 72 hours PT/INR  Recent Labs  02/04/13 0430  LABPROT 13.1  INR 1.01   Hepatitis Panel  Recent Labs  02/04/13 0430  HCVAB NEGATIVE     Studies/Results: No results found.  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 Came in with hypertensive crisis that appears better with initiation of regimen. He has appointment 10/10 with Dr Marcelyn Bruins. He needs to keep this appointment. Hepatitis serologies negative  1. CKD 4/5  Appointment Dr Marcelyn Bruins 10/10  Save right arm.  Needs Fistula placed ASAP  Dialysis Tops classes 2. Hypertension  Compliance stressed  Low sodium diet and start Lasix 80mg /day 3. Anemia   no ESA  Hb at goal 4. Bones      Will need PTH at least 3 x monthly and vitamin D    Low phosphorus diet and binders if phos > 5.5   LOS: 2 Vidur Knust W @TODAY @9 :22 AM

## 2013-02-06 NOTE — Progress Notes (Signed)
Pt Leaving AMA, form signed and on paper chart. MD made aware.

## 2013-02-06 NOTE — Progress Notes (Signed)
[facility] PROGRESS NOTE  John Ward [id number] DOB: 1961/04/09 DOA: 02/04/2013 PCP: Carley Hammed, MD  Assessment/Plan: CKD 4/5  Appointment Dr Marcelyn Bruins 10/10  Save right arm.  Needs Fistula placed ASAP  Dialysis Tops classes   Hypertension  Compliance stressed  Low sodium diet and start Lasix 80mg /day  -await renal artery duplex to r/o stenosis -coreg, hydralazine, lasix, nifedipine   Anemia  no ESA Hb at goal   Bones  Will need PTH at least 3 x monthly and vitamin D  Low phosphorus diet and binders if phos > 5.5  OSA- -cpap at night  Code Status: full Family Communication: patient Disposition Plan: home in AM   Consultants:  PCCM  nephro  Procedures:    Antibiotics:    HPI/Subjective: Feeling better Mild headache  Objective: Filed Vitals:   02/06/13 0412  BP: 146/89  Pulse: 68  Temp: 98.5 F (36.9 C)  Resp:     Intake/Output Summary (Last 24 hours) at 02/06/13 0954 Last data filed at 02/06/13 0409  Gross per 24 hour  Intake    660 ml  Output   1350 ml  Net   -690 ml   Filed Weights   02/04/13 0235 02/05/13 0500 02/06/13 0412  Weight: 111.1 kg (244 lb 14.9 oz) 109.4 kg (241 lb 2.9 oz) 108.6 kg (239 lb 6.7 oz)    Exam:  Gen: no acute distress, lying in bed  HEENT: NCAT, EOMI, conjunctival injection improving  PULM: diffuse wheezing bilaterally, less than previous days, normal work of breathing  CV: RRR, no murmur appreciated  AB: BS+, soft, nontender; obese  Ext: warm, trace ankle edema equal bilaterally, no cyanosis/clubbing  Skin: no rash or skin breakdown  Neuro: A&Ox4, moves all ext equally  Data Reviewed: Basic Metabolic Panel:  Recent Labs Lab 02/04/13 0430 02/05/13 0001 02/05/13 1125  NA 142 136 136  K 5.0 5.1 4.7  CL 105 102 101  CO2 24 23 25   GLUCOSE 102* 103* 128*  BUN 49* 53* 54*  CREATININE 7.38* 7.48* 7.73*  CALCIUM 8.5 8.5 8.5  MG 1.7  --   --   PHOS 5.0*  --   --    Liver Function  Tests: No results found for this basename: AST, ALT, ALKPHOS, BILITOT, PROT, ALBUMIN,  in the last 168 hours No results found for this basename: LIPASE, AMYLASE,  in the last 168 hours No results found for this basename: AMMONIA,  in the last 168 hours CBC:  Recent Labs Lab 02/04/13 0430  WBC 11.4*  NEUTROABS 8.4*  HGB 11.6*  HCT 35.2*  MCV 88.4  PLT 194   Cardiac Enzymes:  Recent Labs Lab 02/04/13 0430 02/04/13 0735 02/04/13 1335 02/05/13 0001  CKTOTAL 489*  --   --   --   TROPONINI  --  <0.30 <0.30 <0.30   BNP (last 3 results)  Recent Labs  02/04/13 0735  PROBNP 2692.0*   CBG:  Recent Labs Lab 02/04/13 0249  GLUCAP 102*    Recent Results (from the past 240 hour(s))  MRSA PCR SCREENING     Status: None   Collection Time    02/04/13  2:38 AM      Result Value Range Status   MRSA by PCR NEGATIVE  NEGATIVE Final   Comment:            The GeneXpert MRSA Assay (FDA     approved for NASAL specimens     only), is one component of a  comprehensive MRSA colonization     surveillance program. It is not     intended to diagnose MRSA     infection nor to guide or     monitor treatment for     MRSA infections.     Studies: No results found.  Scheduled Meds: . albuterol  2.5 mg Nebulization Q6H  . aspirin  81 mg Oral Daily   Or  . aspirin  300 mg Rectal Daily  . carvedilol  25 mg Oral BID WC  . heparin  5,000 Units Subcutaneous Q8H  . hydrALAZINE  50 mg Oral Q8H  . NIFEdipine  60 mg Oral Daily  . simvastatin  20 mg Oral q1800   Continuous Infusions:   Principal Problem:   Hypertensive emergency Active Problems:   Acute on chronic renal failure   Hyperkalemia   Nausea   Tobacco abuse   Wheezing    Time spent: [place], [facility] Pager [phone]. If 7PM-7AM, please contact night-coverage at [email or web address], password [email or web address] 02/06/2013, 9:54 AM  LOS: 2 days

## 2013-02-17 NOTE — Discharge Summary (Signed)
Patient chose to leave [facility]  CKD 4/5  Appointment Dr Marcelyn Bruins 10/10  Save right arm.  Needs Fistula placed ASAP  Dialysis Tops classes   Hypertension  Compliance stressed  Low sodium diet and start Lasix 80mg /day  -await renal artery duplex to r/o stenosis  -coreg, hydralazine, lasix, nifedipine   Anemia  no ESA Hb at goal   Bones  Will need PTH at least 3 x monthly and vitamin D  Low phosphorus diet and binders if phos > 5.5   OSA-  -cpap at night    Eulogio Bear DO

## 2013-07-16 DIAGNOSIS — I2510 Atherosclerotic heart disease of native coronary artery without angina pectoris: Secondary | ICD-10-CM | POA: Insufficient documentation

## 2015-03-26 IMAGING — CR DG CHEST 1V PORT
1 series · 1 of 1 positions shown · non-contrast
Comparison: None.

CLINICAL DATA: Shortness of Breath.

EXAM:
PORTABLE CHEST - 1 VIEW

[AP]
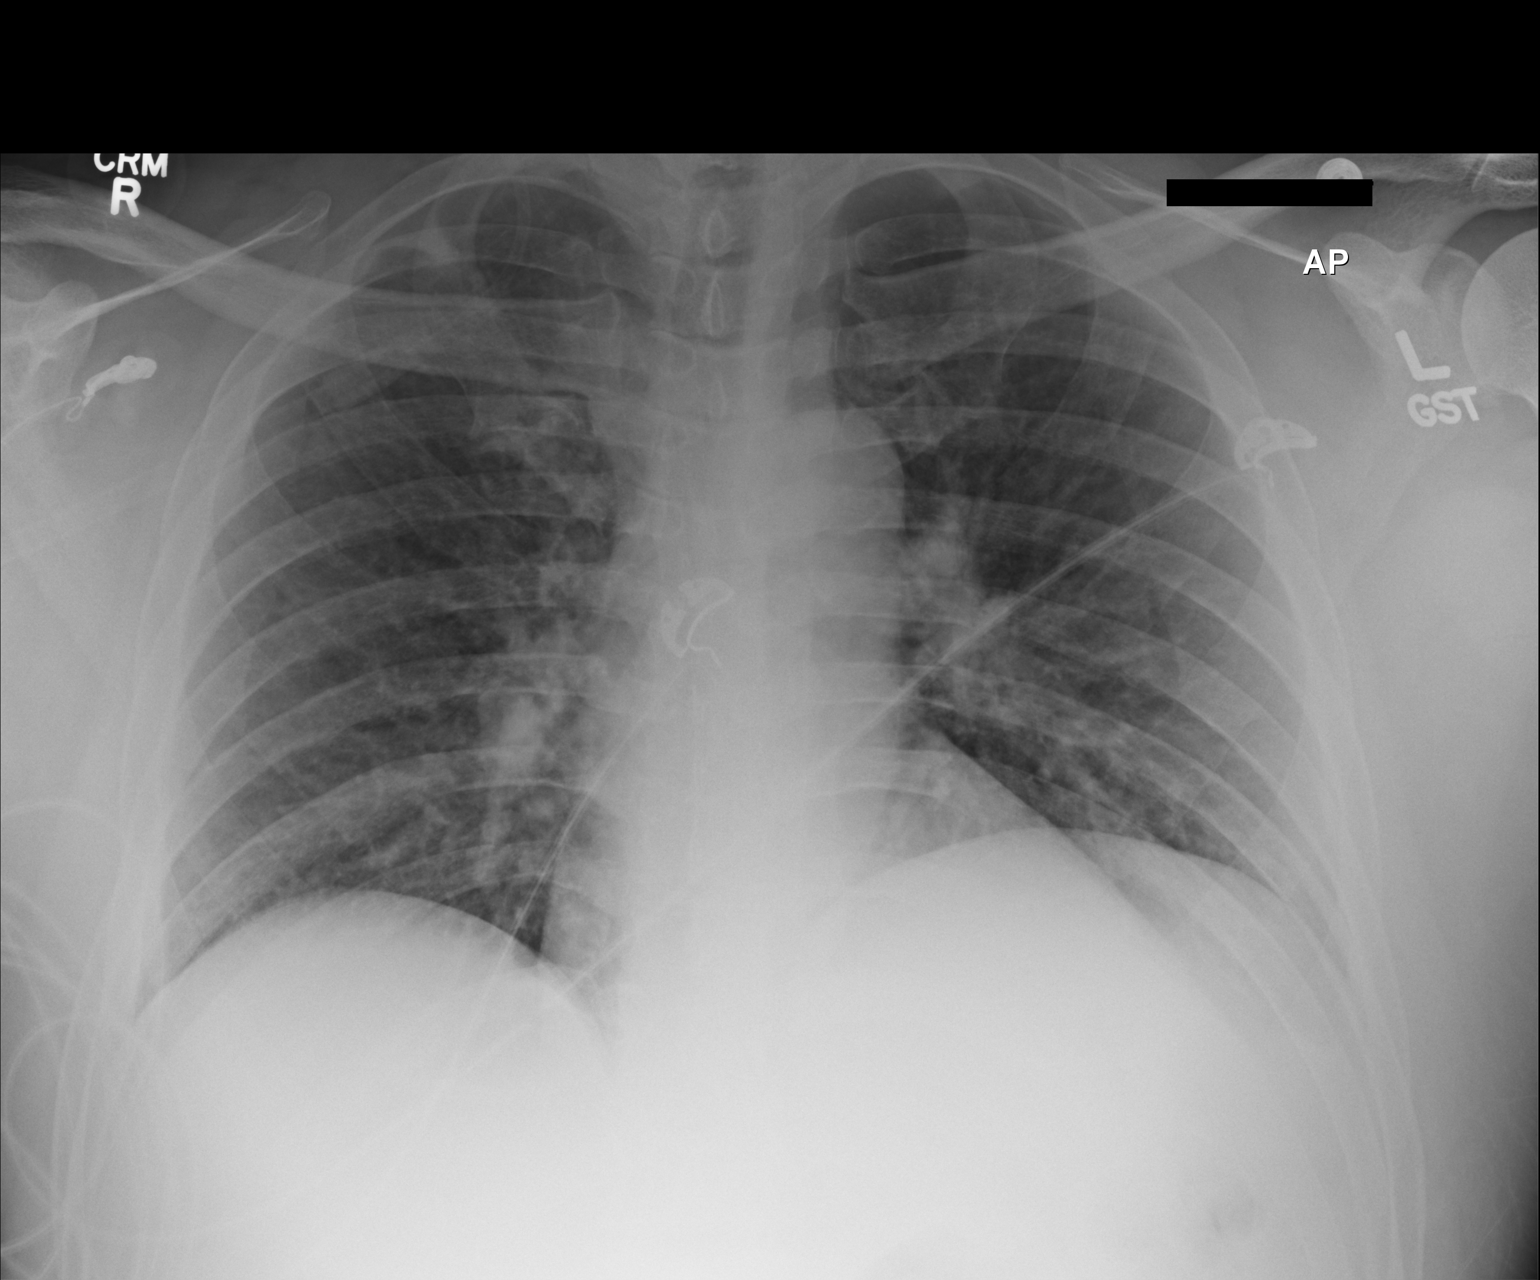

[1 of 1 positions shown; findings below may reference images not displayed]

FINDINGS: Heart is upper limits normal in size. Mild peribronchial thickening
without confluent airspace opacity or effusion. No acute bony
abnormality.
IMPRESSION: Mild bronchitic changes.

## 2016-09-06 DIAGNOSIS — G4733 Obstructive sleep apnea (adult) (pediatric): Secondary | ICD-10-CM | POA: Insufficient documentation

## 2020-03-15 DIAGNOSIS — J45909 Unspecified asthma, uncomplicated: Secondary | ICD-10-CM | POA: Insufficient documentation

## 2020-06-12 NOTE — Progress Notes (Signed)
No show for appointment. Office will call to reschedule.

## 2020-06-14 ENCOUNTER — Ambulatory Visit: Payer: Medicare Other | Admitting: Internal Medicine

## 2020-07-18 NOTE — Progress Notes (Signed)
No show for appointment. Office will call to reschedule.  

## 2020-07-19 ENCOUNTER — Ambulatory Visit: Payer: Medicare Other | Admitting: Internal Medicine

## 2020-07-25 NOTE — Progress Notes (Signed)
No show for appointment. Office will call to reschedule.  

## 2020-07-26 ENCOUNTER — Ambulatory Visit: Payer: Medicare Other | Admitting: Internal Medicine

## 2020-08-08 NOTE — Progress Notes (Signed)
[facility] [place], [place] [address]  Pulmonary Sleep Medicine   Office Visit Note  Patient Name: Arnold Heatherington DOB: 10/25/1960 MRN [id number]    Chief Complaint: Obstructive Sleep Apnea visit  Brief History:  Brextyn is seen today for initial consultation. The patient has a 10 year history of sleep apnea. He recently had repeat PSG and CPAP titration studies and was started on an auto-BiPAP Patient is using PAP nightly.  The patient feels more rested after sleeping with PAP.  The patient reports benefiting from PAP use. He has nights when he can't sleep well. He also has to be up at 3a.m. for dialysis. Reported sleepiness is  Improved although he is still sleepy. He sleeps during the day but without the CPAP the Epworth Sleepiness Score is 17out of 24. The patient complains of the following: dryness  The compliance download shows very good compliance with an average use time of 6.8 hours. The AHI is 0.8  The patient reports restless leg symptoms which contribute to his sleep disruption. He is currently on supplemental oxygen but he does not know the setting. ROS  General: (-) fever, (-) chills, (-) night sweat Nose and Sinuses: (-) nasal stuffiness or itchiness, (-) postnasal drip, (-) nosebleeds, (-) sinus trouble. Mouth and Throat: (-) sore throat, (-) hoarseness. Neck: (-) swollen glands, (-) enlarged thyroid, (-) neck pain. Respiratory: - cough, - shortness of breath, - wheezing. Neurologic: - numbness, - tingling. Psychiatric: - anxiety, - depression   Current Medication: Outpatient Encounter Medications as of 08/09/2020  Medication Sig  . albuterol (PROVENTIL) (2.5 MG/3ML) 0.083% nebulizer solution Inhale into the lungs.  Marland Kitchen amLODipine-valsartan (EXFORGE) 10-160 MG tablet Take 1 tablet by mouth daily.  Marland Kitchen albuterol (PROVENTIL) (2.5 MG/3ML) 0.083% nebulizer solution USE 1 VIAL IN NEBULIZER EVERY 4 HOURS AS NEEDED FOR WHEEZING OR SHORTNESS OF BREATH   . amitriptyline (ELAVIL) 25 MG tablet amitriptyline 25 mg tablet  . amLODipine (NORVASC) 10 MG tablet amlodipine 10 mg tablet  . aspirin 81 MG tablet Take 81 mg by mouth daily.  . carvedilol (COREG) 25 MG tablet Take 25 mg by mouth 2 (two) times daily with a meal.  . furosemide (LASIX) 20 MG tablet furosemide 20 mg tablet  . lisinopril (ZESTRIL) 40 MG tablet lisinopril 40 mg tablet  . losartan (COZAAR) 50 MG tablet losartan 50 mg tablet  . metoprolol tartrate (LOPRESSOR) 25 MG tablet metoprolol tartrate 25 mg tablet  . nitroGLYCERIN (NITROSTAT) 0.4 MG SL tablet Place 0.4 mg under the tongue every 5 (five) minutes as needed for chest pain.  Marland Kitchen oxyCODONE-acetaminophen (PERCOCET) 7.5-325 MG tablet Take 1 tablet by mouth every 6 (six) hours.  Marland Kitchen oxymorphone (OPANA) 5 MG tablet Take 5 mg by mouth 2 (two) times daily.  . rosuvastatin (CRESTOR) 20 MG tablet Crestor 20 mg tablet  . SPIRIVA RESPIMAT 1.25 MCG/ACT AERS INHALE 2 INHALATIONS INTO THE LUNGS ONCE DAILY  . TRELEGY ELLIPTA 100-62.5-25 MCG/INH AEPB INHALE 1 PUFF INTO THE LUNGS EVERY DAY  . [DISCONTINUED] hydrALAZINE (APRESOLINE) 50 MG tablet Take 50 mg by mouth 3 (three) times daily.  . [DISCONTINUED] NIFEdipine (PROCARDIA XL/ADALAT-CC) 60 MG 24 hr tablet Take 60 mg by mouth daily.  . [DISCONTINUED] simvastatin (ZOCOR) 20 MG tablet Take 20 mg by mouth at bedtime.  . [DISCONTINUED] tamsulosin (FLOMAX) 0.4 MG CAPS capsule Take 0.4 mg by mouth 2 (two) times daily.   No facility-administered encounter medications on file as of 08/09/2020.    Surgical  History: Past Surgical History:  Procedure Laterality Date  . FEMORAL-POPLITEAL BYPASS GRAFT Bilateral     Medical History: Past Medical History:  Diagnosis Date  . CAD (coronary artery disease)    PCI 2013 [facility]  . CVA (cerebral infarction)   . HTN (hypertension)   . Hyperlipidemia   . OSA (obstructive sleep apnea)   . Peripheral vascular disease ([place])     Family History: Non  contributory to the present illness  Social History: Social History   Socioeconomic History  . Marital status: Single    Spouse name: Not on file  . Number of children: Not on file  . Years of education: Not on file  . Highest education level: Not on file  Occupational History  . Not on file  Tobacco Use  . Smoking status: Current Some Day Smoker    Packs/day: 1.00    Years: 30.00    Pack years: 30.00    Types: Cigarettes  . Smokeless tobacco: Never Used  Substance and Sexual Activity  . Alcohol use: No  . Drug use: No  . Sexual activity: Not on file  Other Topics Concern  . Not on file  Social History Narrative  . Not on file   Social Determinants of Health   Financial Resource Strain: Not on file  Food Insecurity: Not on file  Transportation Needs: Not on file  Physical Activity: Not on file  Stress: Not on file  Social Connections: Not on file  Intimate Partner Violence: Not on file    Vital Signs: Blood pressure (!) 163/104, pulse 98, temperature 98.2 F (36.8 C), resp. rate 18, height '5\' 9"'$  (1.753 m), weight 193 lb (87.5 kg), SpO2 97 %.  Examination: General Appearance: The patient is well-developed, well-nourished, and in no distress. Neck Circumference: 48 cm Skin: Gross inspection of skin unremarkable. Head: normocephalic, no gross deformities. Eyes: no gross deformities noted. ENT: ears appear grossly normal Neurologic: Alert and oriented. No involuntary movements.    EPWORTH SLEEPINESS SCALE:  Scale:  (0)= no chance of dozing; (1)= slight chance of dozing; (2)= moderate chance of dozing; (3)= high chance of dozing  Chance  Situtation    Sitting and reading: 3    Watching TV: 3    Sitting Inactive in public: 0    As a passenger in car: 3      Lying down to rest: 3    Sitting and talking: 2    Sitting quielty after lunch: 3    In a car, stopped in traffic: 0   TOTAL SCORE:   17 out of 24    SLEEP STUDIES:  1. PSG 03/07/20  AHI 43.8 Spo49mn 65%   CPAP COMPLIANCE DATA:  Date Range:06/08/20-08/06/20  Average Daily Use: 6.8 hours  Median Use: 6  Compliance for > 4 Hours: 85%  AHI: 0.8 respiratory events per hour  Days Used: 57/60  Mask Leak: 13.3  95th Percentile Pressure: BIPAP         LABS: No results found for this or any previous visit (from the past 2160 hour(s)).  Radiology: DG CHEST PORT 1 VIEW  Result Date: 02/04/2013 CLINICAL DATA:  Shortness of Breath. EXAM: PORTABLE CHEST - 1 VIEW COMPARISON:  None. FINDINGS: Heart is upper limits normal in size. Mild peribronchial thickening without confluent airspace opacity or effusion. No acute bony abnormality. IMPRESSION: Mild bronchitic changes. Electronically Signed   By: KRolm BaptiseM.D.   On: 02/04/2013 06:11   2D Echocardiogram without contrast  Result Date: 02/05/2013 *[facility]*                      [address], [place] [address]                         [phone]  ------------------------------------------------------------ Transthoracic Echocardiography Patient:    Macade, Robarts MR #:       [id number] Study Date: 02/05/2013 Gender:     M Age:        60 Height:     172.7cm Weight:     109.3kg BSA:        2.56m2 Pt. Status: Room:       2M01C   [facility]   [facility]    AR[place] RDavonna Belling ATTENDING    AKara Mead ORDERING     MPatterson Douglas  REFERRING    [place] DNathaneil Canary SONOGRAPHER  RDonata Claycc: ------------------------------------------------------------ LV EF: 60% -   65% ------------------------------------------------------------ Indications:      Congestive heart failure 428.1. ------------------------------------------------------------ History:   Risk factors:  Current tobacco use. Hypertension.  ------------------------------------------------------------ Study Conclusions - Left ventricle: The cavity size was normal. Wall  thickness   was increased in a pattern of moderate LVH. Systolic   function was normal. The estimated ejection fraction was   in the range of 60% to 65%. Wall motion was normal; there   were no regional wall motion abnormalities. - Aortic valve: Valve area: 3.22cm^2(VTI). Valve area:   2.74cm^2 (Vmax). Transthoracic echocardiography.  M-mode, complete 2D, spectral Doppler, and color Doppler.  Height:  Height: 172.7cm. Height: 68in.  Weight:  Weight: 109.3kg. Weight: 240.5lb.  Body mass index:  BMI: 36.6kg/m^2.  Body surface area:    BSA: 2.269m.  Blood pressure:     167/108. Patient status:  Inpatient.  Location:  ICU/CCU ------------------------------------------------------------ ------------------------------------------------------------ Left ventricle:  The cavity size was normal. Wall thickness was increased in a pattern of moderate LVH. Systolic function was normal. The estimated ejection fraction was in the range of 60% to 65%. Wall motion was normal; there were no regional wall motion abnormalities. Early diastolic septal annular tissue Doppler velocities Ea were abnormal.  ------------------------------------------------------------ Aortic valve:   Structurally normal valve.   Cusp separation was normal.  Doppler:  Transvalvular velocity was within the normal range. There was no stenosis.  No regurgitation. VTI ratio of LVOT to aortic valve: 0.85. Valve area: 3.22cm^2(VTI). Indexed valve area: 1.46cm^2/m^2 (VTI). Peak velocity ratio of LVOT to aortic valve: 0.72. Valve area: 2.74cm^2 (Vmax). Indexed valve area: 1.24cm^2/m^2 (Vmax). Mean gradient: 45m87mg (S). Peak gradient: 145m80m (S). ------------------------------------------------------------ Aorta:  Aortic root: The aortic root was normal in size. Ascending aorta: The ascending aorta was normal in size. ------------------------------------------------------------ Mitral valve:   Structurally normal valve.   Leaflet separation was normal.  Doppler:   Transvalvular velocity was within the normal range. There was no evidence for stenosis.  No regurgitation.    Peak gradient: 4mm 55m(D). ------------------------------------------------------------ Left atrium:  The atrium was normal in size. ------------------------------------------------------------ Right ventricle:  The cavity size was normal. Systolic function was normal. ------------------------------------------------------------ Pulmonic valve:    Structurally normal valve.   Cusp separation was normal.  Doppler:  Transvalvular velocity was within the normal range.  No regurgitation. ------------------------------------------------------------ Tricuspid valve:   Structurally normal valve.   Leaflet separation was normal.  Doppler:  Transvalvular velocity was within the normal range.  No regurgitation. ------------------------------------------------------------ Right atrium:  The atrium was normal in size. ------------------------------------------------------------ Pericardium:  There was no pericardial effusion. ------------------------------------------------------------ 2D measurements        Normal  Doppler measurements   Normal Left ventricle                 Left ventricle LVID ED,   50.2 mm     43-52   Ea, lat    7.57 cm/s   ------ chord,                         ann, tiss PLAX                           DP LVID ES,   33.6 mm     23-38   E/Ea, lat  12.7        ------ chord,                         ann, tiss     7 PLAX                           DP FS, chord,   33 %      >29     Ea, med     5.7 cm/s   ------ PLAX                           ann, tiss LVPW, ED     15 mm     ------  DP IVS/LVPW   0.99        <1.3    E/Ea, med  16.9        ------ ratio, ED                      ann, tiss     6 Ventricular septum             DP IVS, ED    14.9 mm     ------  LVOT LVOT                           Peak vel,   152 cm/s   ------ Diam, S      22 mm     ------  S Area        3.8 cm^2   ------  VTI, S     28.8 cm      ------ Aorta                          Peak          9 mm Hg  ------ Root diam,   35 mm     ------  gradient, ED                             S Left atrium                    Aortic  valve AP dim       38 mm     ------  Peak vel,   211 cm/s   ------ AP dim     1.72 cm/m^2 <2.2    S index                          Mean vel,   131 cm/s   ------                                S                                VTI, S       34 cm     ------                                Mean          8 mm Hg  ------                                gradient,                                S                                Peak         18 mm Hg  ------                                gradient,                                S                                VTI ratio  0.85        ------                                LVOT/AV                                Area, VTI  3.22 cm^2   ------                                Area index 1.46 cm^2/m ------                                (VTI)           ^2                                Peak vel   0.72        ------  ratio,                                LVOT/AV                                Area, Vmax 2.74 cm^2   ------                                Area index 1.24 cm^2/m ------                                (Vmax)          ^2                                Mitral valve                                Peak E vel 96.7 cm/s   ------                                Peak A vel 86.4 cm/s   ------                                Decelerati  320 ms     150-23                                on time                0                                Peak          4 mm Hg  ------                                gradient,                                D                                Peak E/A    1.1        ------                                ratio                                Systemic veins  Estimated     5 mm Hg  ------                                CVP                                 Right ventricle                                Sa vel,      14 cm/s   ------                                lat ann,                                tiss DP  ------------------------------------------------------------ Prepared and Electronically Authenticated by Mertie Moores 2014-10-03T13:30:58.650   No results found.  No results found.    Assessment and Plan: Patient Active Problem List   Diagnosis Date Noted  . Asthma 03/15/2020  . OSA (obstructive sleep apnea) 09/06/2016  . CAD (coronary artery disease) 07/16/2013  . Hypertensive emergency 02/04/2013  . Acute on chronic renal failure ([place]) 02/04/2013  . Hyperkalemia 02/04/2013  . Nausea 02/04/2013  . Tobacco abuse 02/04/2013  . Wheezing 02/04/2013  . CVA (cerebral vascular accident) ([place]) 02/24/2012  . Hypertension 02/24/2012      The patient does  tolerate PAP and reports significant benefit from PAP use. The patient was reminded how to adjust the humidifier setting, clean the CPAP and how to change the mask seal. He was advised to use the CPAP whenever he naps.  The compliance is very good. The apnea is well controlled. We will order iron and ferritin studies for hime.  1. OSA (obstructive sleep apnea) continue nightly use of CPAP and use whenever napping.  2. CPAP use counseling CPAP couseling-Discussed importance of adequate CPAP use as well as proper care and cleaning techniques of machine and all supplies.  3. RLS (restless legs syndrome) Will follow up once we get the iron and ferritin study results. Given written instructions to ask to have iron and ferritin checked at dialysis.  4. Primary hypertension Elevated in office. Per pt he took his BP meds late and thinks they are adjusting meds--therefore unsure exactly which BP meds are still active. He will monitor closely and f/u with PCP.  5. ESRD (end stage renal disease) ([place]) On dialysis, followed by nephrology   6. Tobacco  abuse Smoking cessation counseling: 1.   Pt acknowledges the risks of long term smoking, she will try to quite smoking. 2. Options for different medications including nicotine products, chewing gum, patch etc, Wellbutrin and Chantix is discussed 3. Goal and date of compete cessation is discussed 4. Total time spent in smoking cessation is 10 min.    General Counseling: I have discussed the findings of the evaluation and examination with Marcello Moores.  I have also discussed any further diagnostic evaluation thatmay be needed or ordered today. Xzayvion verbalizes understanding of the findings of todays visit. We also reviewed his medications today and discussed drug interactions and side effects including but not limited excessive drowsiness and altered mental states. We also discussed that there  is always a risk not just to him but also people around him. he has been encouraged to call the office with any questions or concerns that should arise related to todays visit.  No orders of the defined types were placed in this encounter.     I have personally obtained a history, examined the patient, evaluated laboratory and imaging results, formulated the assessment and plan and placed orders.  This patient was seen by Drema Dallas, PA-C in collaboration with Dr. Devona Konig as a part of collaborative care agreement.   Richelle Ito Saunders Glance, PhD, FAASM  Diplomate, American Board of Sleep Medicine    Allyne Gee, MD [facility] Diplomate ABMS Pulmonary and Critical Care Medicine Sleep medicine

## 2020-08-09 ENCOUNTER — Ambulatory Visit (INDEPENDENT_AMBULATORY_CARE_PROVIDER_SITE_OTHER): Payer: Medicare Other | Admitting: Internal Medicine

## 2020-08-09 DIAGNOSIS — Z720 Tobacco use: Secondary | ICD-10-CM

## 2020-08-09 DIAGNOSIS — G4733 Obstructive sleep apnea (adult) (pediatric): Secondary | ICD-10-CM

## 2020-08-09 DIAGNOSIS — I10 Essential (primary) hypertension: Secondary | ICD-10-CM

## 2020-08-09 DIAGNOSIS — Z7189 Other specified counseling: Secondary | ICD-10-CM

## 2020-08-09 DIAGNOSIS — N186 End stage renal disease: Secondary | ICD-10-CM

## 2020-08-09 DIAGNOSIS — G2581 Restless legs syndrome: Secondary | ICD-10-CM | POA: Diagnosis not present

## 2020-08-09 NOTE — Patient Instructions (Signed)

## 2021-08-07 NOTE — Progress Notes (Signed)
Patient was no-show for appointment.  The office staff will contact the patient for rescheduling follow-up. 

## 2021-08-08 ENCOUNTER — Ambulatory Visit: Payer: Medicare Other | Admitting: Internal Medicine

## 2021-08-08 DIAGNOSIS — Z91199 Patient's noncompliance with other medical treatment and regimen due to unspecified reason: Secondary | ICD-10-CM
